# Patient Record
Sex: Female | Born: 1975 | Race: White | Hispanic: No | Smoking: Never smoker
Health system: Southern US, Community
[De-identification: ages and names within clinical notes are randomized; demographics above are authoritative.]

## PROBLEM LIST (undated history)

## (undated) DIAGNOSIS — J40 Bronchitis, not specified as acute or chronic: Secondary | ICD-10-CM

## (undated) DIAGNOSIS — W57XXXA Bitten or stung by nonvenomous insect and other nonvenomous arthropods, initial encounter: Secondary | ICD-10-CM

## (undated) DIAGNOSIS — E785 Hyperlipidemia, unspecified: Secondary | ICD-10-CM

## (undated) DIAGNOSIS — F32A Depression, unspecified: Secondary | ICD-10-CM

## (undated) DIAGNOSIS — T7840XA Allergy, unspecified, initial encounter: Secondary | ICD-10-CM

## (undated) DIAGNOSIS — N200 Calculus of kidney: Secondary | ICD-10-CM

## (undated) HISTORY — DX: Allergy, unspecified, initial encounter: T78.40XA

## (undated) HISTORY — DX: Bitten or stung by nonvenomous insect and other nonvenomous arthropods, initial encounter: W57.XXXA

## (undated) HISTORY — DX: Calculus of kidney: N20.0

## (undated) HISTORY — DX: Bronchitis, not specified as acute or chronic: J40

## (undated) HISTORY — DX: Hyperlipidemia, unspecified: E78.5

## (undated) HISTORY — PX: WISDOM TOOTH EXTRACTION: SHX21

## (undated) HISTORY — PX: FRACTURE SURGERY: SHX138

---

## 1999-05-17 ENCOUNTER — Other Ambulatory Visit: Admission: RE | Admit: 1999-05-17 | Discharge: 1999-05-17 | Payer: Self-pay | Admitting: Obstetrics and Gynecology

## 1999-06-17 ENCOUNTER — Encounter (INDEPENDENT_AMBULATORY_CARE_PROVIDER_SITE_OTHER): Payer: Self-pay

## 1999-06-17 ENCOUNTER — Other Ambulatory Visit: Admission: RE | Admit: 1999-06-17 | Discharge: 1999-06-17 | Payer: Self-pay | Admitting: Obstetrics and Gynecology

## 2000-04-12 ENCOUNTER — Other Ambulatory Visit: Admission: RE | Admit: 2000-04-12 | Discharge: 2000-04-12 | Payer: Self-pay | Admitting: Obstetrics and Gynecology

## 2001-10-29 ENCOUNTER — Other Ambulatory Visit: Admission: RE | Admit: 2001-10-29 | Discharge: 2001-10-29 | Payer: Self-pay | Admitting: Obstetrics and Gynecology

## 2002-02-19 ENCOUNTER — Encounter: Payer: Self-pay | Admitting: Emergency Medicine

## 2002-02-19 ENCOUNTER — Emergency Department (HOSPITAL_COMMUNITY): Admission: EM | Admit: 2002-02-19 | Discharge: 2002-02-19 | Payer: Self-pay | Admitting: Emergency Medicine

## 2002-10-31 ENCOUNTER — Other Ambulatory Visit: Admission: RE | Admit: 2002-10-31 | Discharge: 2002-10-31 | Payer: Self-pay | Admitting: Obstetrics and Gynecology

## 2006-09-24 ENCOUNTER — Inpatient Hospital Stay (HOSPITAL_COMMUNITY): Admission: AD | Admit: 2006-09-24 | Discharge: 2006-09-25 | Payer: Self-pay | Admitting: Obstetrics & Gynecology

## 2007-05-16 ENCOUNTER — Inpatient Hospital Stay (HOSPITAL_COMMUNITY): Admission: AD | Admit: 2007-05-16 | Discharge: 2007-05-21 | Payer: Self-pay | Admitting: Obstetrics and Gynecology

## 2009-08-07 ENCOUNTER — Encounter: Admission: RE | Admit: 2009-08-07 | Discharge: 2009-08-07 | Payer: Self-pay | Admitting: Family Medicine

## 2010-07-20 NOTE — Discharge Summary (Signed)
NAME:  Savannah Johnson, CASTROGIOVANNI              ACCOUNT NO.:  [id number]   MEDICAL RECORD NO.:  [id number]          PATIENT TYPE:  INP   LOCATION:  9141                          FACILITY:  [facility]   PHYSICIAN:  Naima A. Dillard, M.D. DATE OF BIRTH:  [birth year]   DATE OF ADMISSION:  05/16/2007  DATE OF DISCHARGE:  05/21/2007                               DISCHARGE SUMMARY   ADMISSION DIAGNOSES:  1. Intrauterine pregnancy at 67 and 2/7th weeks.  2. Unfavorable cervix.   DISCHARGE DIAGNOSES:  1. Intrauterine pregnancy at 57 and 2/7th weeks.  2. Unfavorable cervix.  3. Failure to progress and macrosomia and status post caesarean      delivery of a female infant, named Fayrene Fearing, weighing 10 pounds 1 ounce,      Apgars 9 and 9.   HOSPITAL PROCEDURES:  1. Cervidil ripening of cervix.  2. Pitocin induction of labor.  3. Internal fetal monitors.  4. Epidural anesthesia.  5. Primary low-transverse caesarean section.   HOSPITAL COURSE:  The patient was admitted for induction of labor  secondary to postdates.  Cervix was unfavorable so a Cervidil was  placed.  The next morning, Cervidil was removed and amniotomy was  performed.  Cervix was 1-2 cm at that time.  IUPC was placed.  She  progressed 4 cm but remained there for 10 hours with no change and  decision was made to proceed with caesarean section, which was performed  under epidural anesthesia for a viable female infant, named Fayrene Fearing,  weighing 10 pounds 1 ounce.  Apgars 9 and 9.  Estimated blood loss was  750 mL.  She was taken to recovery and then to Mother/Baby Unit where  she did well.  On postop day #2, she was doing well except for being  tired and tearful due to frustrations of breastfeeding.  Lactation  consultant assisted her.  On postop day #2, she was doing well,  breastfeeding was improving, lochia was small, hemoglobin was 10.3, and  she continued to receive routine care.  She was seen by social work for  history of depression and felt to be  stable for discharge.  On postop  day #3, she was ready to go home.  She was breastfeeding with  Supplemental Nursing System.  Vital signs were stable.  Chest was clear.  Heart rate, regular rate and rhythm.  Abdomen was soft and appropriately  tender.  Incision was clean, dry, and intact.  Lochia was normal.  Extremities within normal limits, and she was deemed to have received  full benefit of her hospital stay and was discharged home.   CONDITION ON DISCHARGE:  Good.   DISCHARGE MEDICATIONS:  1. Motrin 600 mg p.o. q.6 h. p.r.n.  2. Tylox 1-2 p.o. q.4 h. p.r.n.   DISCHARGE LABS:  White blood cell count 17.3, hemoglobin 10.3, and  platelets 244.  RPR nonreactive.   DISCHARGE INSTRUCTIONS:  Per [facility] Obstetrics handout;  discharge follow up in 6 weeks or p.r.n.      Marie L. Williams, C.N.M.      Naima A. Normand Sloop, M.D.  Electronically Signed  MLW/MEDQ  D:  05/21/2007  T:  05/21/2007  Job:  [id number]

## 2010-07-20 NOTE — Op Note (Signed)
NAME:  Savannah Johnson, Savannah Johnson              ACCOUNT NO.:  [id number]   MEDICAL RECORD NO.:  [id number]          PATIENT TYPE:  INP   LOCATION:  9176                          [facility]ACILITY:  [facility]   PHYSICIAN:  Hal Morales, M.D.DATE OF BIRTH:  1975/05/13   DATE OF PROCEDURE:  05/18/2007  DATE OF DISCHARGE:                               OPERATIVE REPORT   PREOPERATIVE DIAGNOSES:  1. Intrauterine pregnancy at 41-1/2 weeks' gestation.  2. Failure to progress in labor.   POSTOPERATIVE DIAGNOSES:  1. Intrauterine pregnancy at 41-1/2 weeks' gestation.  2. Failure to progress in labor.  3. Fetal macrosomia.   PROCEDURE:  Primary low transverse cesarean section.   SURGEON:  Hal Morales, M.D.   FIRST ASSISTANT:  Museum/gallery exhibitions officer, CNM   ANESTHESIA:  Epidural.   ESTIMATED BLOOD LOSS:  750 mL.   COMPLICATIONS:  None.   FINDINGS:  The patient was delivered of a female infant whose name is  Fayrene Fearing, weighing 10 pounds 1 ounce, with Apgars of 9 and 9 at one and  five minutes, respectively.  The uterus, tubes and ovaries were normal  for the gravid state.   PROCEDURE:  The patient was taken to the operating room after  appropriate identification and placed on the operating table.  She had  her labor epidural and Foley catheter in place.  The abdomen was prepped  with Technicare antiseptic and draped as a sterile field.  The  suprapubic region was infiltrated with 10 mL of 0.25% Marcaine.  A  suprapubic incision was made and the abdomen opened in layers.  The  peritoneum was entered and the bladder blade placed.  The uterus was  incised approximately 2 cm above the uterovesical fold and that incision  taken laterally on either side bluntly.  The infant was delivered from  the occiput transverse position and after having the nares and pharynx  suctioned and cord clamped and cut was handed off to the awaiting  pediatricians.  The appropriate cord blood was drawn and the placenta  allowed  to separate from the uterus and removed from the operative  field.  It was given to the [facility] employee for cord  blood banking.  The uterine incision was then closed with a running  interlocking suture of 0 Vicryl.  An imbricating suture of 0 Vicryl  allowed for adequate hemostasis.  Copious irrigation was carried out.  The abdominal peritoneum was closed with a running suture of 2-0 Vicryl.  The rectus muscles were reapproximated in the midline with a figure-of-  eight suture of 2-0 Vicryl.  The rectus fascia was closed with a running  suture of 0 Vicryl, then reinforced on either side of midline with a  figure-of-eight suture of 2-0 Vicryl.  The subcutaneous tissue was made  hemostatic with Bovie cautery and the skin incision closed with a  subcuticular suture of 3-0 Monocryl.  Steri-Strips were applied and a  sterile dressing applied.  The patient was taken from the operating room  to the recovery room in satisfactory condition, having tolerated the  procedure well with sponge and  instrument counts correct.   SPECIMENS TO PATHOLOGY:  None.  The placenta went to birthing suites.   The infant went to the full-term nursery.      Hal Morales, M.D.  Electronically Signed     VPH/MEDQ  D:  05/18/2007  T:  05/19/2007  Job:  [id number]
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
## 2010-11-29 LAB — CBC
HCT: 29.4 — ABNORMAL LOW
HCT: 36.3
Hemoglobin: 10.3 — ABNORMAL LOW
Hemoglobin: 12.7
MCHC: 35.1
MCHC: 35.1
MCV: 95.7
MCV: 96.6
Platelets: 244
Platelets: 285
RBC: 3.04 — ABNORMAL LOW
RBC: 3.79 — ABNORMAL LOW
RDW: 14.8
RDW: 15.3
WBC: 11.6 — ABNORMAL HIGH
WBC: 17.3 — ABNORMAL HIGH

## 2010-11-29 LAB — RPR: RPR Ser Ql: NONREACTIVE

## 2010-12-20 LAB — URINALYSIS, ROUTINE W REFLEX MICROSCOPIC
Glucose, UA: NEGATIVE
Specific Gravity, Urine: 1.025

## 2010-12-20 LAB — URINE MICROSCOPIC-ADD ON

## 2012-08-14 ENCOUNTER — Ambulatory Visit (INDEPENDENT_AMBULATORY_CARE_PROVIDER_SITE_OTHER): Payer: BC Managed Care – PPO | Admitting: Physician Assistant

## 2012-08-14 VITALS — BP 106/72 | HR 80 | Temp 98.6°F | Resp 16 | Ht 65.0 in | Wt 189.0 lb

## 2012-08-14 DIAGNOSIS — T148 Other injury of unspecified body region: Secondary | ICD-10-CM

## 2012-08-14 DIAGNOSIS — W57XXXA Bitten or stung by nonvenomous insect and other nonvenomous arthropods, initial encounter: Secondary | ICD-10-CM

## 2012-08-14 MED ORDER — DOXYCYCLINE HYCLATE 100 MG PO CAPS: 100.0000 mg | ORAL_CAPSULE | Freq: Two times a day (BID) | ORAL | Status: DC

## 2012-08-14 NOTE — Progress Notes (Signed)
  Subjective:    Patient ID: Savannah Johnson, female    DOB: [birth year], 37 y.o.   MRN: [id number]  HPI   [name] is a very pleasant 37 yr old female here with concern for bug bites at right inner thigh.  Has 3 bites, present for 1-1.5 weeks now.  Does not know what bit her.  Son and husband have had similar bites.  Does not know if bites are related.  Has not noted bed bugs.  The bites are itchy, not painful.  Using OTC anti-itch cream.  One bite has been worsening over the last several days - now with spreading redness and hardness.  Denies fever, chills, arthralgias, myalgias.  But does endorse fatigue and "not being herself" today.   Review of Systems  Constitutional: Positive for fatigue. Negative for fever and chills.  HENT: Negative.   Respiratory: Negative.   Cardiovascular: Negative.   Musculoskeletal: Negative for myalgias and arthralgias.  Skin: Positive for wound (bug bite).  Neurological: Negative for headaches.       Objective:   Physical Exam  Vitals reviewed. Constitutional: She is oriented to person, place, and time. She appears well-developed and well-nourished. No distress.  HENT:  Head: Normocephalic and atraumatic.  Eyes: Conjunctivae are normal. No scleral icterus.  Cardiovascular: Normal rate, regular rhythm and normal heart sounds.   Pulmonary/Chest: Effort normal. She has no wheezes. She has no rales.  Abdominal: Soft. There is no tenderness.  Neurological: She is alert and oriented to person, place, and time.  Skin: Skin is warm and dry.     Insect bite of right inner thigh with surrounding erythema and induration - with bulls-eye appearance; no fluctuance, slight warmth; scabbed area centrally - lifted gently with 11 blade, removed what appears to be tick mouth parts with splinter forceps  Psychiatric: She has a normal mood and affect. Her behavior is normal.         Assessment & Plan:  Tick bite - Plan: doxycycline (VIBRAMYCIN) 100 MG  capsule   [name] is a very pleasant 37 yr old female with probable tick bite, with retained mouth parts.  Remaining tick parts removed.  There is surrounding erythema and induration but no definite abscess.  Will cover her with doxy x 10 days (pt is not using contraception, but is currently menstruating).  Doxy should cover tick borne disease as well as secondary infection.  Discussed RTC precautions.  Pt understands and is in agreement with this plan.

## 2012-08-14 NOTE — Patient Instructions (Addendum)
Begin taking the doxycycline today.  Take as directed and be sure to finish the full course.  This medicine will cover you for tick-borne illness [facility] Spotted Fever, Lyme) and also secondary skin infection.  Please let me know if any symptoms are worsening or not improving, or if new symptoms develop.

## 2012-08-20 ENCOUNTER — Telehealth: Payer: Self-pay

## 2012-08-20 NOTE — Telephone Encounter (Signed)
PATIENT WOULD LIKE TO SPEAK WITH SOMEONE REGARDING HER TICK BITE.  WENT TO ANOTHER URGENT CARE OVER THE WEEKEND B/C SHE WASN'T FEELING WELL.    [phone]

## 2012-08-20 NOTE — Telephone Encounter (Signed)
Pt called and had questions about some symptoms she was having and if it was some side effected of the doxy. She has had some diarrhea Friday and Saturday, she has also has had some fatigue, stiff neck, concentration issues, thought process issues, muscle pain and headache. She wanted to know if these symptoms was coming from the medication.  She also went to an urgent care near her home and they drew blood there for a tick panel.  Per Lanora Manis the medication can cause GI upset but not the other symptoms and for those she should come in to be evaluated.

## 2012-09-24 ENCOUNTER — Ambulatory Visit (INDEPENDENT_AMBULATORY_CARE_PROVIDER_SITE_OTHER): Payer: BC Managed Care – PPO | Admitting: Family Medicine

## 2012-09-24 VITALS — BP 126/70 | HR 68 | Temp 97.9°F | Resp 18 | Ht 65.0 in | Wt 190.0 lb

## 2012-09-24 DIAGNOSIS — R109 Unspecified abdominal pain: Secondary | ICD-10-CM

## 2012-09-24 DIAGNOSIS — R1013 Epigastric pain: Secondary | ICD-10-CM

## 2012-09-24 DIAGNOSIS — K3189 Other diseases of stomach and duodenum: Secondary | ICD-10-CM

## 2012-09-24 LAB — POCT CBC
Granulocyte percent: 74.6 %G (ref 37–80)
HCT, POC: 45.2 % (ref 37.7–47.9)
Hemoglobin: 14.5 g/dL (ref 12.2–16.2)
MCV: 98 fL — AB (ref 80–97)
POC Granulocyte: 5.8 (ref 2–6.9)
RBC: 4.61 M/uL (ref 4.04–5.48)
RDW, POC: 13.2 %

## 2012-09-24 LAB — POCT URINALYSIS DIPSTICK
Bilirubin, UA: NEGATIVE
Ketones, UA: NEGATIVE
Leukocytes, UA: NEGATIVE
Spec Grav, UA: 1.025
pH, UA: 6

## 2012-09-24 LAB — POCT UA - MICROSCOPIC ONLY

## 2012-09-24 LAB — COMPREHENSIVE METABOLIC PANEL
ALT: 13 U/L (ref 0–35)
CO2: 29 mEq/L (ref 19–32)
Calcium: 9.3 mg/dL (ref 8.4–10.5)
Chloride: 102 mEq/L (ref 96–112)
Creat: 0.71 mg/dL (ref 0.50–1.10)
Glucose, Bld: 79 mg/dL (ref 70–99)
Total Protein: 7.6 g/dL (ref 6.0–8.3)

## 2012-09-24 MED ORDER — OMEPRAZOLE 40 MG PO CPDR: 40.0000 mg | DELAYED_RELEASE_CAPSULE | Freq: Every day | ORAL | Status: DC

## 2012-09-24 NOTE — Progress Notes (Signed)
Subjective: 37 year old lady who has been having a lot of dyspepsia over the last couple of weeks. She's got burning in her stomach. No nausea or vomiting. She feels bloated in some discomfort. The pain is primarily in the epigastrium. No specific food intolerances. Antacids only gave brief relief. She took one Nexium last night, can't tell the difference yet. She has one 58-year-old child. No dyspareunia. Is on her menstrual cycle now.  Objective: Pale overweight lady in no obvious distress. Chest clear. Heart regular without murmurs. Abdomen has normal bowel sounds. Soft without organomegaly or masses. Mild tenderness in the mid and lower epigastrium.   Results for orders placed in visit on 09/24/12  POCT CBC      Result Value Range   WBC 7.8  4.6 - 10.2 K/uL   Lymph, poc 1.6  0.6 - 3.4   POC LYMPH PERCENT 21.0  10 - 50 %L   MID (cbc) 0.3  0 - 0.9   POC MID % 4.4  0 - 12 %M   POC Granulocyte 5.8  2 - 6.9   Granulocyte percent 74.6  37 - 80 %G   RBC 4.61  4.04 - 5.48 M/uL   Hemoglobin 14.5  12.2 - 16.2 g/dL   HCT, POC 40.9  81.1 - 47.9 %   MCV 98.0 (*) 80 - 97 fL   MCH, POC 31.5 (*) 27 - 31.2 pg   MCHC 32.1  31.8 - 35.4 g/dL   RDW, POC 91.4     Platelet Count, POC 256  142 - 424 K/uL   MPV 8.6  0 - 99.8 fL  POCT UA - MICROSCOPIC ONLY      Result Value Range   WBC, Ur, HPF, POC 0-4     RBC, urine, microscopic tntc     Bacteria, U Microscopic 1+     Mucus, UA large     Epithelial cells, urine per micros 0-6     Crystals, Ur, HPF, POC neg     Casts, Ur, LPF, POC neg     Yeast, UA neg    POCT URINALYSIS DIPSTICK      Result Value Range   Color, UA yellow     Clarity, UA cloudy     Glucose, UA neg     Bilirubin, UA neg     Ketones, UA neg     Spec Grav, UA 1.025     Blood, UA large     pH, UA 6.0     Protein, UA neg     Urobilinogen, UA 0.2     Nitrite, UA neg     Leukocytes, UA Negative     Assessment: Abdominal pain and dyspepsia  Plan: Omeprazole 40 daily If  worse acutely going to emergency Return if not improving and imaging studies might be needed.

## 2012-09-24 NOTE — Patient Instructions (Addendum)
Take omeprazole one hour before breakfast   Avoid rich /spicy foods or anything that you know upsets your stomach.  Return if worse at any time or if not improving over next week.  If symptoms improve with medicine but then return, we will need ot consider further studies.

## 2013-01-10 ENCOUNTER — Other Ambulatory Visit: Payer: Self-pay

## 2013-06-27 ENCOUNTER — Ambulatory Visit: Payer: Self-pay | Admitting: Cardiology

## 2013-07-01 ENCOUNTER — Encounter: Payer: Self-pay | Admitting: Cardiology

## 2013-07-01 ENCOUNTER — Encounter: Payer: Self-pay | Admitting: *Deleted

## 2013-07-01 DIAGNOSIS — Z87898 Personal history of other specified conditions: Secondary | ICD-10-CM | POA: Insufficient documentation

## 2013-07-01 DIAGNOSIS — W57XXXA Bitten or stung by nonvenomous insect and other nonvenomous arthropods, initial encounter: Secondary | ICD-10-CM | POA: Insufficient documentation

## 2013-07-01 DIAGNOSIS — T7840XA Allergy, unspecified, initial encounter: Secondary | ICD-10-CM | POA: Insufficient documentation

## 2013-07-03 ENCOUNTER — Ambulatory Visit (INDEPENDENT_AMBULATORY_CARE_PROVIDER_SITE_OTHER): Payer: BC Managed Care – PPO | Admitting: Cardiology

## 2013-07-03 ENCOUNTER — Encounter: Payer: Self-pay | Admitting: Cardiology

## 2013-07-03 ENCOUNTER — Encounter: Payer: Self-pay | Admitting: *Deleted

## 2013-07-03 VITALS — BP 126/88 | HR 80 | Ht 64.0 in | Wt 194.0 lb

## 2013-07-03 DIAGNOSIS — R0989 Other specified symptoms and signs involving the circulatory and respiratory systems: Principal | ICD-10-CM

## 2013-07-03 DIAGNOSIS — R9431 Abnormal electrocardiogram [ECG] [EKG]: Secondary | ICD-10-CM | POA: Insufficient documentation

## 2013-07-03 DIAGNOSIS — R0600 Dyspnea, unspecified: Secondary | ICD-10-CM | POA: Insufficient documentation

## 2013-07-03 DIAGNOSIS — R0609 Other forms of dyspnea: Secondary | ICD-10-CM

## 2013-07-03 NOTE — Progress Notes (Signed)
     HPI: 38 year old female for evaluation of dyspnea. Recently evaluated for dyspnea and felt to have probable bronchitis. Electrocardiogram showed sinus rhythm and prior septal infarct cannot be excluded. Cardiology asked to evaluate. Patient has had difficulties with dyspnea on exertion for approximately 6 months. There is no orthopnea, PND, pedal edema or syncope. She does occasionally have chest tightness. This occurs both with exertion and at rest. It increases with cough. Because of her abnormal electrocardiogram and dyspnea cardiology asked to evaluate. Note some of her dyspnea worsened after she discontinued her Zyrtec.  Current Outpatient Prescriptions  Medication Sig Dispense Refill  . cetirizine (ZYRTEC) 10 MG tablet Take 10 mg by mouth daily.      . Estradiol-Estriol-Progesterone (BIEST/PROGESTERONE) CREA Place onto the skin. Pt uses cream every two weeks      . Evening Primrose Oil 1000 MG CAPS Take 1 capsule by mouth daily.      . Omega-3 Fatty Acids (FISH OIL) 1200 MG CAPS Take 1 capsule by mouth daily.      . Prenatal Vit-Fe Fumarate-FA (PRENATAL MULTIVITAMIN) TABS Take 1 tablet by mouth daily at 12 noon.       No current facility-administered medications for this visit.    Allergies  Allergen Reactions  . Betadine [Povidone Iodine] Rash  . Latex Rash    Past Medical History  Diagnosis Date  . Allergy   . Tick bite   . Hyperlipidemia   . Bronchitis   . Nephrolithiasis     Past Surgical History  Procedure Laterality Date  . Cesarean section    . Fracture surgery    . Wisdom tooth extraction      History   Social History  . Marital Status: Married    Spouse Name: N/A    Number of Children: 1  . Years of Education: N/A   Occupational History  .      Biomedical scientistArt director   Social History Main Topics  . Smoking status: Never Smoker   . Smokeless tobacco: Not on file  . Alcohol Use: Yes     Comment: ocassional  . Drug Use: No  . Sexual Activity: Yes   Birth Control/ Protection: None   Other Topics Concern  . Not on file   Social History Narrative  . No narrative on file    Family History  Problem Relation Age of Onset  . Cancer Father     skin  . Heart disease Father     MI at age 36    ROS: no fevers or chills, productive cough, hemoptysis, dysphasia, odynophagia, melena, hematochezia, dysuria, hematuria, rash, seizure activity, orthopnea, PND, pedal edema, claudication. Remaining systems are negative.  Physical Exam:   Blood pressure 126/88, pulse 80, height 5\' 4"  (1.626 m), weight 194 lb (87.998 kg).  General:  Well developed/well nourished in NAD Skin warm/dry Patient not depressed No peripheral clubbing Back-normal HEENT-normal/normal eyelids Neck supple/normal carotid upstroke bilaterally; no bruits; no JVD; no thyromegaly chest - CTA/ normal expansion CV - RRR/normal S1 and S2; no murmurs, rubs or gallops;  PMI nondisplaced Abdomen -NT/ND, no HSM, no mass, + bowel sounds, no bruit 2+ femoral pulses, no bruits Ext-no edema, chords, 2+ DP Neuro-grossly nonfocal  ECG Sinus rhythm with no ST changes

## 2013-07-03 NOTE — Patient Instructions (Signed)
Your physician recommends that you schedule a follow-up appointment in: AS NEEDED PENDING TEST RESULTS  Your physician has requested that you have an echocardiogram. Echocardiography is a painless test that uses sound waves to create images of your heart. It provides your doctor with information about the size and shape of your heart and how well your heart's chambers and valves are working. This procedure takes approximately one hour. There are no restrictions for this procedure.    

## 2013-07-03 NOTE — Assessment & Plan Note (Signed)
I have reviewed previous electrocardiogram. This showed sinus rhythm and prior septal infarct cannot be excluded. This may be related to lead placement. We will await echocardiogram. If wall motion normal no further cardiac evaluation needed.

## 2013-07-03 NOTE — Assessment & Plan Note (Signed)
Dyspnea seems more likely to be allergy or pulmonary related. She is not volume overloaded on examination. I will arrange an echocardiogram to assess LV function. If normal I do not think further cardiac workup is necessary. She does have some chest tightness but this may be related to pulmonary status as well as it increases with cough. Her symptoms worsened after discontinuing Zyrtec.

## 2013-07-11 ENCOUNTER — Ambulatory Visit: Payer: Self-pay | Admitting: Cardiology

## 2013-07-23 ENCOUNTER — Other Ambulatory Visit (HOSPITAL_COMMUNITY): Payer: BC Managed Care – PPO

## 2019-09-22 ENCOUNTER — Encounter: Payer: Self-pay | Admitting: Emergency Medicine

## 2019-09-22 ENCOUNTER — Emergency Department (INDEPENDENT_AMBULATORY_CARE_PROVIDER_SITE_OTHER): Payer: BC Managed Care – PPO

## 2019-09-22 ENCOUNTER — Other Ambulatory Visit: Payer: Self-pay

## 2019-09-22 ENCOUNTER — Emergency Department
Admission: EM | Admit: 2019-09-22 | Discharge: 2019-09-22 | Disposition: A | Discharge status: Home / Self Care | Payer: BC Managed Care – PPO | Source: Home / Self Care | Attending: Family Medicine | Admitting: Family Medicine

## 2019-09-22 DIAGNOSIS — M7702 Medial epicondylitis, left elbow: Secondary | ICD-10-CM

## 2019-09-22 DIAGNOSIS — M25522 Pain in left elbow: Secondary | ICD-10-CM | POA: Diagnosis not present

## 2019-09-22 DIAGNOSIS — Z8781 Personal history of (healed) traumatic fracture: Secondary | ICD-10-CM

## 2019-09-22 HISTORY — DX: Depression, unspecified: F32.A

## 2019-09-22 NOTE — Discharge Instructions (Addendum)
Put ice on the injured area. Put ice in a plastic bag. Place a towel between your skin and the bag. Leave the ice on for 20 minutes, 2-3 times a day. May take Ibuprofen 200mg , 4 tabs every 8 hours with food.  Begin range of motion and stretching exercises as tolerated. Do not wear elbow brace at night

## 2019-09-22 NOTE — ED Triage Notes (Signed)
Patient here reporting pain in left elbow for past 6 days; no recent injury but this joint has plates and screws from injury about 20 years ago; has been taking ibuprofen 800 mg po for past days without relief; last night took husband's vicodin. Has had covid vaccination.

## 2019-09-22 NOTE — ED Provider Notes (Signed)
[facility]    CSN: [id number] Arrival date & time: 09/22/19  1347      History   Chief Complaint Chief Complaint  Patient presents with  . Elbow Pain    HPI Savannah Johnson is a 44 y.o. female.   Patient complains of pain in her left medial elbow for 6 days.  She recalls no recent injury or change in activities that could have caused the pain.  She had a left elbow injury about 20 years ago resulting in a distal humerus fracture requiring ORIF.  She has orthopedic fixation hardware in place.    The history is provided by the patient.    Past Medical History:  Diagnosis Date  . Allergy   . Bronchitis   . Depression   . Hyperlipidemia   . Nephrolithiasis   . Tick bite     Patient Active Problem List   Diagnosis Date Noted  . Dyspnea 07/03/2013  . Nonspecific abnormal electrocardiogram (ECG) (EKG) 07/03/2013  . Allergy   . History of abdominal pain   . Tick bite     Past Surgical History:  Procedure Laterality Date  . CESAREAN SECTION    . FRACTURE SURGERY    . WISDOM TOOTH EXTRACTION      OB History   No obstetric history on file.      Home Medications    Prior to Admission medications   Medication Sig Start Date End Date Taking? Authorizing Provider  escitalopram (LEXAPRO) 10 MG tablet Take 10 mg by mouth daily.   Yes [provider]  cetirizine (ZYRTEC) 10 MG tablet Take 10 mg by mouth daily.    [provider]  Estradiol-Estriol-Progesterone (BIEST/PROGESTERONE) CREA Place onto the skin. Pt uses cream every two weeks    [provider]  Evening Primrose Oil 1000 MG CAPS Take 1 capsule by mouth daily.    [provider]  Omega-3 Fatty Acids (FISH OIL) 1200 MG CAPS Take 1 capsule by mouth daily.    [provider]  Prenatal Vit-Fe Fumarate-FA (PRENATAL MULTIVITAMIN) TABS Take 1 tablet by mouth daily at 12 noon.    [provider]    Family History Family History  Problem Relation  Age of Onset  . Cancer Father        skin  . Heart disease Father        MI at age 16    Social History Social History   Tobacco Use  . Smoking status: Never Smoker  Substance Use Topics  . Alcohol use: Yes    Comment: ocassional  . Drug use: No     Allergies   Betadine [povidone iodine] and Latex   Review of Systems Review of Systems  Constitutional: Negative for activity change, chills, diaphoresis, fatigue and fever.  Musculoskeletal: Negative for joint swelling.  Skin: Negative for rash.  Neurological: Negative for numbness.  All other systems reviewed and are negative.    Physical Exam Triage Vital Signs ED Triage Vitals  Enc Vitals Group     BP 09/22/19 1410 (!) 157/91     Pulse Rate 09/22/19 1410 (!) 7     Resp 09/22/19 1410 16     Temp 09/22/19 1410 98.1 F (36.7 C)     Temp Source 09/22/19 1410 Oral     SpO2 09/22/19 1410 99 %     Weight 09/22/19 1411 210 lb (95.3 kg)     Height 09/22/19 1411 5\' 4"  (1.626 m)  Head Circumference --      Peak Flow --      Pain Score 09/22/19 1411 8     Pain Loc --      Pain Edu? --      Excl. in GC? --    No data found.  Updated Vital Signs BP (!) 157/91 (BP Location: Right Arm)   Pulse (!) 7   Temp 98.1 F (36.7 C) (Oral)   Resp 16   Ht 5\' 4"  (1.626 m)   Wt 95.3 kg   SpO2 99%   BMI 36.05 kg/m   Visual Acuity Right Eye Distance:   Left Eye Distance:   Bilateral Distance:    Right Eye Near:   Left Eye Near:    Bilateral Near:     Physical Exam Vitals and nursing note reviewed.  Constitutional:      General: She is not in acute distress. HENT:     Head: Normocephalic.  Eyes:     Pupils: Pupils are equal, round, and reactive to light.  Cardiovascular:     Rate and Rhythm: Normal rate.  Pulmonary:     Effort: Pulmonary effort is normal.  Musculoskeletal:        General: No swelling or deformity.       Arms:     Cervical back: Normal range of motion.     Comments: There is distinct  tenderness over the left medial epicondyle.  Palpation there during resisted wrist flexion and pronation of the wrist recreates her pain.  There is no erythema, warmth, or swelling.  Skin:    General: Skin is warm and dry.     Findings: No erythema or rash.  Neurological:     Mental Status: She is alert.      UC Treatments / Results  Labs (all labs ordered are listed, but only abnormal results are displayed) Labs Reviewed - No data to display  EKG   Radiology DG Elbow Complete Left  Result Date: 09/22/2019 CLINICAL DATA:  Left elbow pain for 5 days. No recent injury. Remote history of prior surgery. EXAM: LEFT ELBOW - COMPLETE 3+ VIEW COMPARISON:  None. FINDINGS: There is no acute bony or joint abnormality. No joint effusion or degenerative change about the elbow. Healed distal humerus fracture with fixation hardware in place and healed proximal ulnar fracture versus osteotomy defect with a single screw in place noted. Soft tissues are unremarkable. IMPRESSION: No acute abnormality.  No degenerative change about the elbow. Postoperative change distal right humerus and proximal ulna as described. No complicating feature. Electronically Signed   By: 09/24/2019 M.D.   On: 09/22/2019 15:03    Procedures Procedures (including critical care time)  Medications Ordered in UC Medications - No data to display  Initial Impression / Assessment and Plan / UC Course  I have reviewed the triage vital signs and the nursing notes.  Pertinent labs & imaging results that were available during my care of the patient were reviewed by me and considered in my medical decision making (see chart for details).    Exam consistent with medial epicondylitis, but cause is not clear. Dispensed tennis elbow brace. Given sprain treatment instructions with range of motion and stretching exercises.  Followup with orthopedist if not improved 2 weeks.   Final Clinical Impressions(s) / UC Diagnoses    Final diagnoses:  Medial epicondylitis of elbow, left  Left elbow pain     Discharge Instructions     1. Put ice on  the injured area. ? Put ice in a plastic bag. ? Place a towel between your skin and the bag. ? Leave the ice on for 20 minutes, 2-3 times a day. 2. May take Ibuprofen 200mg , 4 tabs every 8 hours with food.  3. Begin range of motion and stretching exercises as tolerated. 4. Do not wear elbow brace at night    ED Prescriptions    None        , MD 09/24/19 [phone]

## 2020-08-17 IMAGING — DX DG ELBOW COMPLETE 3+V*L*
4 series · 4 of 4 positions shown · non-contrast
Comparison: None.

CLINICAL DATA: Left elbow pain for 5 days. No recent injury. Remote
history of prior surgery.

EXAM:
LEFT ELBOW - COMPLETE 3+ VIEW

[elbow ap]
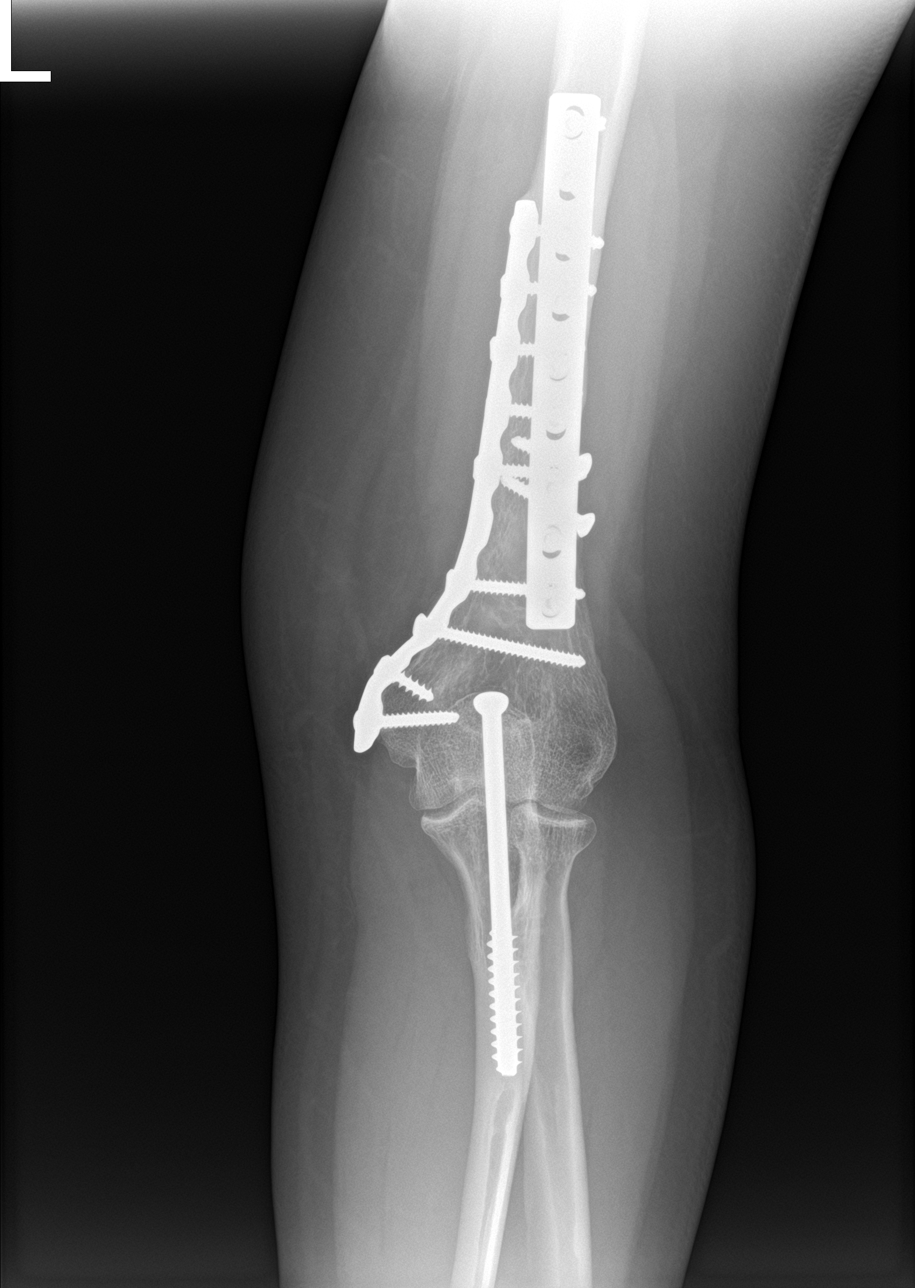

[elbow lat]
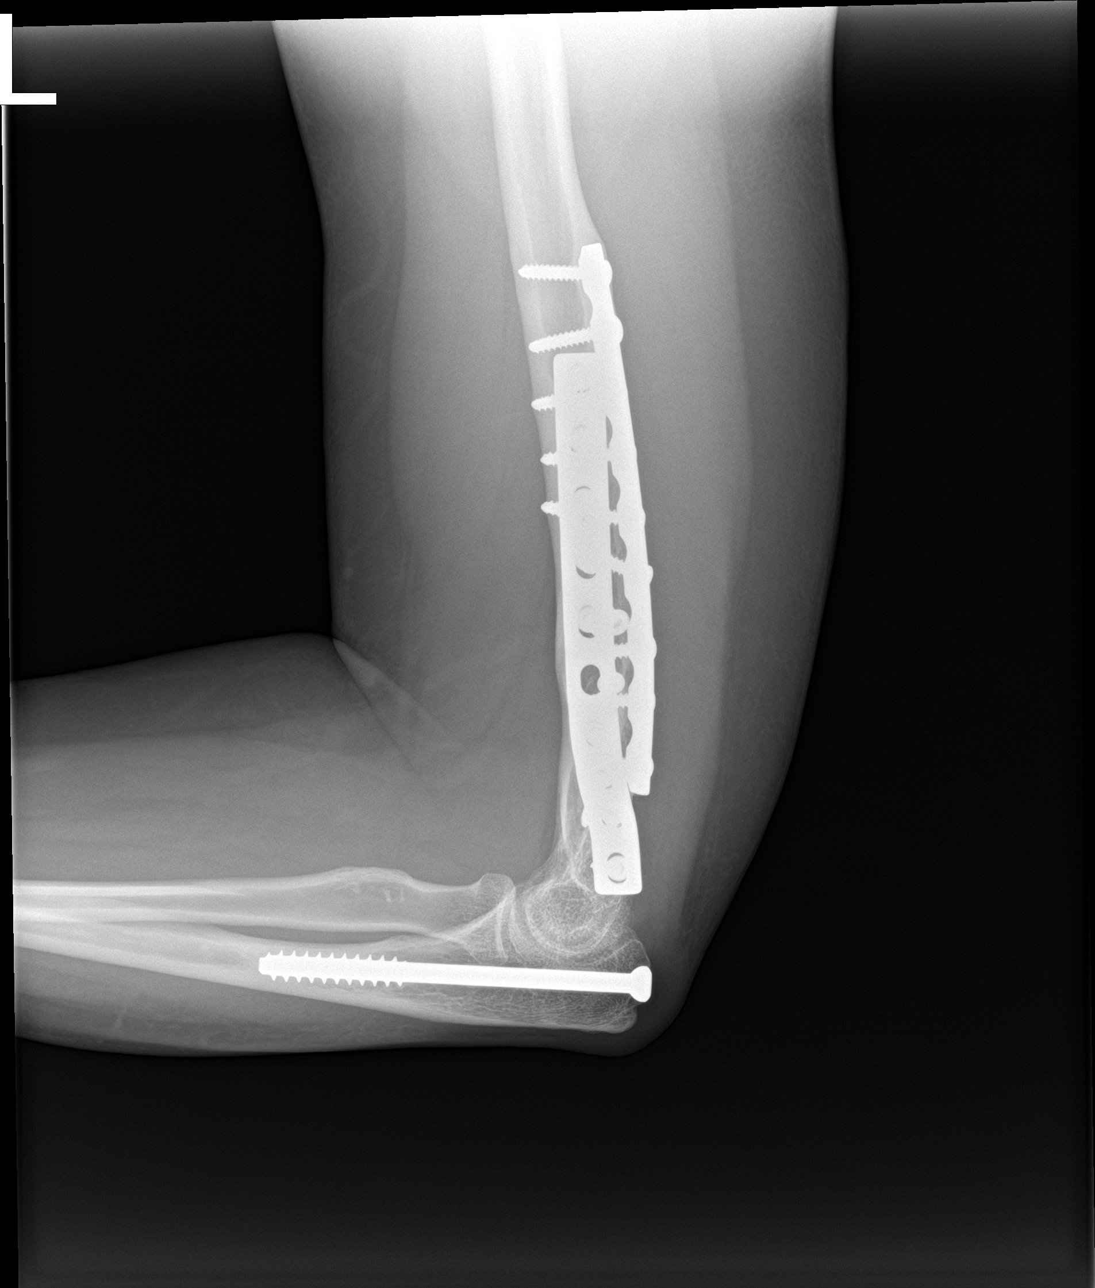

[elbow obl (1 of 2)]
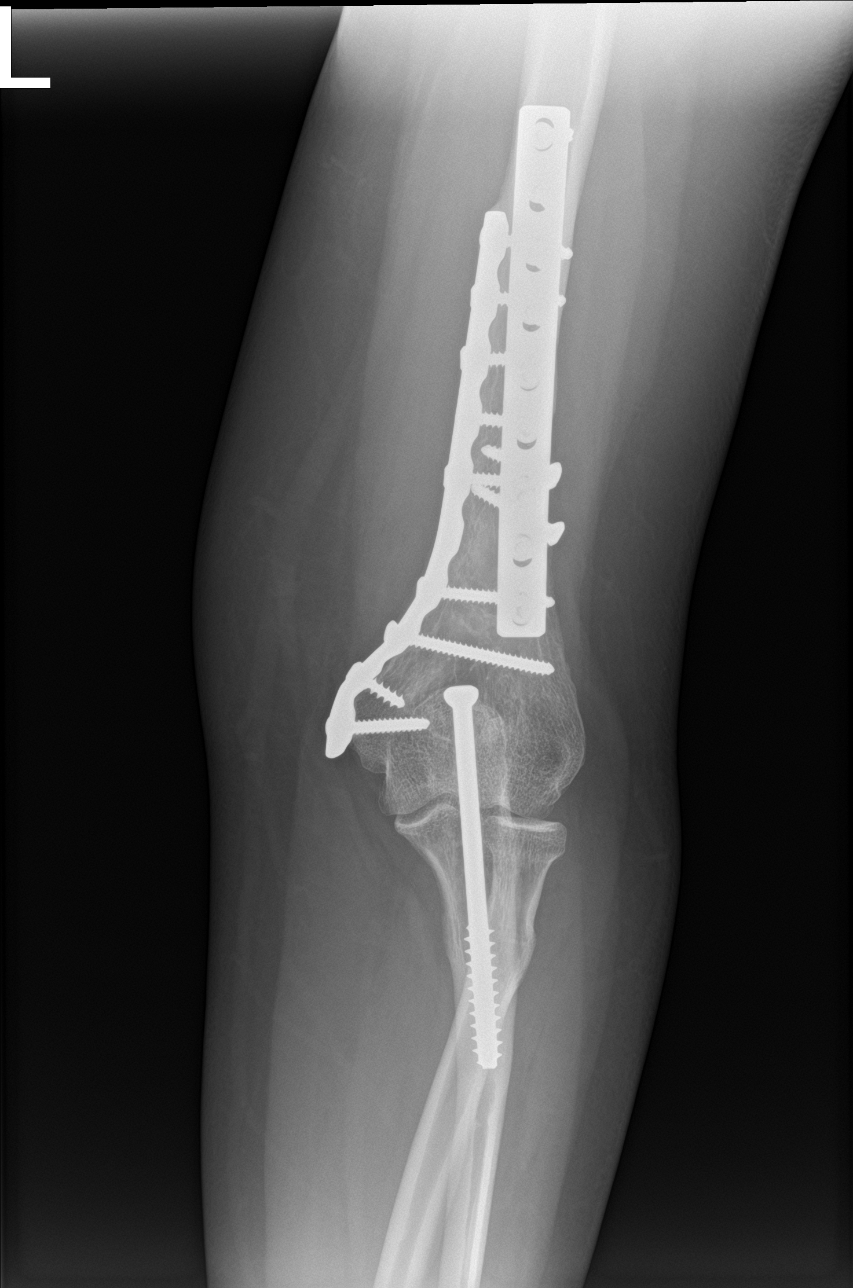

[elbow obl (2 of 2)]
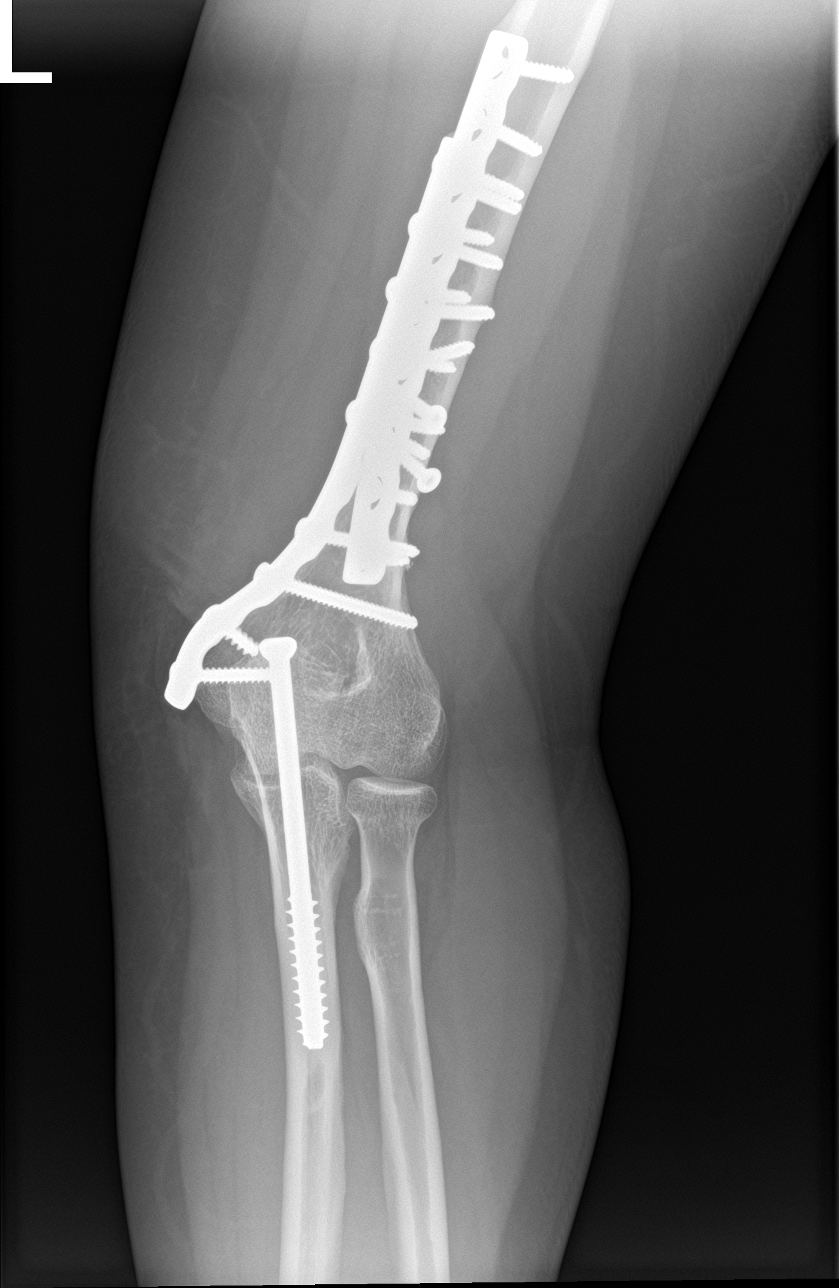

[4 of 4 positions shown; findings below may reference images not displayed]

FINDINGS: There is no acute bony or joint abnormality. No joint effusion or
degenerative change about the elbow. Healed distal humerus fracture
with fixation hardware in place and healed proximal ulnar fracture
versus osteotomy defect with a single screw in place noted. Soft
tissues are unremarkable.
IMPRESSION: No acute abnormality.  No degenerative change about the elbow.

Postoperative change distal right humerus and proximal ulna as
described. No complicating feature.

## 2020-12-29 ENCOUNTER — Ambulatory Visit: Payer: Self-pay

## 2020-12-29 ENCOUNTER — Ambulatory Visit (INDEPENDENT_AMBULATORY_CARE_PROVIDER_SITE_OTHER): Payer: BC Managed Care – PPO | Admitting: Sports Medicine

## 2020-12-29 VITALS — BP 136/96 | Ht 64.0 in | Wt 220.0 lb

## 2020-12-29 DIAGNOSIS — M25572 Pain in left ankle and joints of left foot: Secondary | ICD-10-CM | POA: Diagnosis not present

## 2020-12-29 DIAGNOSIS — M79609 Pain in unspecified limb: Secondary | ICD-10-CM | POA: Diagnosis not present

## 2020-12-29 DIAGNOSIS — M79672 Pain in left foot: Secondary | ICD-10-CM

## 2020-12-29 DIAGNOSIS — G8929 Other chronic pain: Secondary | ICD-10-CM

## 2020-12-29 DIAGNOSIS — M205X2 Other deformities of toe(s) (acquired), left foot: Secondary | ICD-10-CM

## 2020-12-29 DIAGNOSIS — M2141 Flat foot [pes planus] (acquired), right foot: Secondary | ICD-10-CM | POA: Diagnosis not present

## 2020-12-29 DIAGNOSIS — M205X1 Other deformities of toe(s) (acquired), right foot: Secondary | ICD-10-CM

## 2020-12-29 DIAGNOSIS — M2142 Flat foot [pes planus] (acquired), left foot: Secondary | ICD-10-CM

## 2020-12-29 NOTE — Assessment & Plan Note (Signed)
See plan for foot support and conservative care

## 2020-12-29 NOTE — Progress Notes (Signed)
PCP: Dalbert Mayotte, MD  Subjective:   HPI: Patient is a pleasant 45 y.o. female here for left heel and lateral foot pain.  Tane presents with left posterior lateral heel pain for the last 2-3 months.  She denies any inciting event or trauma.  She states she will have pain in the posterior heel near the insertion point of the Achilles. This pain is worse after prolonged standing or activity.  At times it feels like a burning sensation.  She does note some swelling occasionally, but no erythema or ecchymosis. She has also had some pain that wraps around more the lateral aspect of the heel into the midfoot.  Notes that her pain is worse with certain shoewear, specifically with Flip-flops or slippers.  She has not tried any shoewear modification orthotics previously.  She does take ibuprofen as needed for more breakthrough severe pain.  Past Medical History:  Diagnosis Date   Allergy    Bronchitis    Depression    Hyperlipidemia    Nephrolithiasis    Tick bite     Current Outpatient Medications on File Prior to Visit  Medication Sig Dispense Refill   cetirizine (ZYRTEC) 10 MG tablet Take 10 mg by mouth daily.     escitalopram (LEXAPRO) 10 MG tablet Take 10 mg by mouth daily.     Estradiol-Estriol-Progesterone (BIEST/PROGESTERONE) CREA Place onto the skin. Pt uses cream every two weeks     Evening Primrose Oil 1000 MG CAPS Take 1 capsule by mouth daily.     Omega-3 Fatty Acids (FISH OIL) 1200 MG CAPS Take 1 capsule by mouth daily.     Prenatal Vit-Fe Fumarate-FA (PRENATAL MULTIVITAMIN) TABS Take 1 tablet by mouth daily at 12 noon.     No current facility-administered medications on file prior to visit.    Past Surgical History:  Procedure Laterality Date   CESAREAN SECTION     FRACTURE SURGERY     WISDOM TOOTH EXTRACTION      Allergies  Allergen Reactions   Betadine [Povidone Iodine] Rash   Latex Rash    BP (!) 136/96   Ht 5\' 4"  (1.626 m)   Wt 220 lb (99.8 kg)   BMI 37.76  kg/m   [facility] 12/29/2020  Frequency of aerobic exercise (# of days/week) 1  Average time in minutes 0  Frequency of strengthening activities (# of days/week) 0    No flowsheet data found.      Objective:  Physical Exam:  Gen: Well-appearing, in no acute distress; non-toxic CV: Regular Rate. Well-perfused. Warm.  Resp: Breathing unlabored on room air; no wheezing. Psych: Fluid speech in conversation; appropriate affect; normal thought process Neuro: Sensation intact throughout. No gross coordination deficits.  MSK:   - Bilateral pes planus.  - Gait analysis: She has notable hindfoot valgus, left worse than right.  There is overpronation bilaterally upon stance phase.  No notable valgus or varus stress at the knee through swing phase. - Left foot/ankle: Flat foot with complete loss of transverse arch upon standing.  There is TTP noted over the retrocalcaneal bursa, although no significant erythema or swelling.  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  She has full range of motion in all directions.  There is mild pain with resisted inversion of the ankle.  Strength is 5/5.  There is limited extension of bilateral first MTP.  MSK Limited ultrasound performed, left ankle/foot  - The left Achilles tendon  was identified at the insertion point of the calcaneus both short and long axis.  There is no increased thickening of the tendon.  No evidence of tearing or intratendinous edema. - The left lateral ankle was identified with visualization of the peroneal tendons.  There are dues appear to be a peroneal longus, brevis, as well as Accessory peroneal quartius Tendon.  There is very mild hyperemia uptake just distal to the lateral malleolus, although no evidence of tearing or focal edema within these 3 tendons.  Identification of the peroneal brevis tendon on its insertion of the fifth metatarsal was identified  without pathology. 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  No significant tendon disruption or edema.  Normal Achilles tendon. Insertional calcific spurring  Ultrasound and interpretation by Dr. Shon Baton and Sibyl Parr. Fields, MD     Assessment & Plan:  1. 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 2. Bilateral pes planus with loss of arch upon standing 3. Left foot hindfoot valgus 4. Hallux limitus bilaterally 5. Peroneal pain, left foot  The patient has Multiple conditions causing her pain specifically in the left foot, I believe these are all biomechanical manifestations of her flat foot and hindfoot valgus with arch collapse.  Limited ultrasound did not show any thickening of the Achilles tendon or significant edema within the retrocalcaneal bursa.   - Patient was fitted with green insoles with bilateral scaphoid pad and left heel lift of 3/16'', which improved her gait and pain significantly - She may use topical Voltaren gel or over-the-counter anti-inflammatories for pain control - She may use the new insoles over the next 2-3 weeks, and return if she would like additional or custom orthotics made for more longer term use  Madelyn Brunner, DO PGY-4, Sports Medicine Fellow [facility]  I observed and examined the patient with the [facility] resident and agree with assessment and plan.  Note reviewed and modified by me. Sterling Big, MD

## 2023-03-05 LAB — COLOGUARD: COLOGUARD: NEGATIVE
